# Patient Record
Sex: Male | Born: 1955 | Race: Black or African American | Hispanic: No | Marital: Married | Smoking: Current every day smoker
Health system: Southern US, Community
[De-identification: ages and names within clinical notes are randomized; demographics above are authoritative.]

## PROBLEM LIST (undated history)

## (undated) DIAGNOSIS — E291 Testicular hypofunction: Secondary | ICD-10-CM

## (undated) DIAGNOSIS — D649 Anemia, unspecified: Secondary | ICD-10-CM

## (undated) DIAGNOSIS — E538 Deficiency of other specified B group vitamins: Secondary | ICD-10-CM

## (undated) DIAGNOSIS — S0300XA Dislocation of jaw, unspecified side, initial encounter: Secondary | ICD-10-CM

## (undated) HISTORY — PX: COLONOSCOPY: SHX174

## (undated) HISTORY — PX: JOINT REPLACEMENT: SHX530

---

## 2008-02-08 ENCOUNTER — Ambulatory Visit: Payer: Self-pay | Admitting: Internal Medicine

## 2008-07-17 ENCOUNTER — Emergency Department: Payer: Self-pay | Admitting: Emergency Medicine

## 2011-07-04 ENCOUNTER — Ambulatory Visit: Payer: Self-pay | Admitting: Gastroenterology

## 2011-07-06 LAB — PATHOLOGY REPORT

## 2013-07-22 ENCOUNTER — Ambulatory Visit: Payer: Self-pay | Admitting: Gastroenterology

## 2013-07-24 LAB — PATHOLOGY REPORT

## 2013-09-23 ENCOUNTER — Ambulatory Visit: Payer: Self-pay | Admitting: Gastroenterology

## 2017-11-28 DIAGNOSIS — E538 Deficiency of other specified B group vitamins: Secondary | ICD-10-CM

## 2017-11-28 HISTORY — DX: Deficiency of other specified B group vitamins: E53.8

## 2018-02-08 ENCOUNTER — Ambulatory Visit
Admission: RE | Admit: 2018-02-08 | Discharge: 2018-02-08 | Disposition: A | Discharge status: Home / Self Care | Payer: Managed Care, Other (non HMO) | Source: Ambulatory Visit | Attending: Surgery | Admitting: Surgery

## 2018-02-08 ENCOUNTER — Other Ambulatory Visit: Payer: Self-pay

## 2018-02-08 ENCOUNTER — Encounter
Admission: RE | Admit: 2018-02-08 | Discharge: 2018-02-08 | Disposition: A | Discharge status: Home / Self Care | Payer: Managed Care, Other (non HMO) | Source: Ambulatory Visit | Attending: Surgery | Admitting: Surgery

## 2018-02-08 DIAGNOSIS — Z01810 Encounter for preprocedural cardiovascular examination: Secondary | ICD-10-CM | POA: Insufficient documentation

## 2018-02-08 DIAGNOSIS — Z01812 Encounter for preprocedural laboratory examination: Secondary | ICD-10-CM | POA: Insufficient documentation

## 2018-02-08 DIAGNOSIS — M1611 Unilateral primary osteoarthritis, right hip: Secondary | ICD-10-CM | POA: Insufficient documentation

## 2018-02-08 DIAGNOSIS — Z01818 Encounter for other preprocedural examination: Secondary | ICD-10-CM | POA: Insufficient documentation

## 2018-02-08 DIAGNOSIS — I498 Other specified cardiac arrhythmias: Secondary | ICD-10-CM | POA: Insufficient documentation

## 2018-02-08 HISTORY — DX: Anemia, unspecified: D64.9

## 2018-02-08 HISTORY — DX: Testicular hypofunction: E29.1

## 2018-02-08 HISTORY — DX: Dislocation of jaw, unspecified side, initial encounter: S03.00XA

## 2018-02-08 HISTORY — DX: Deficiency of other specified B group vitamins: E53.8

## 2018-02-08 LAB — CBC WITH DIFFERENTIAL/PLATELET
BASOS ABS: 0 10*3/uL (ref 0–0.1)
Basophils Relative: 1 %
EOS PCT: 1 %
Eosinophils Absolute: 0.1 10*3/uL (ref 0–0.7)
HEMATOCRIT: 41 % (ref 40.0–52.0)
Hemoglobin: 13.6 g/dL (ref 13.0–18.0)
LYMPHS ABS: 1.3 10*3/uL (ref 1.0–3.6)
LYMPHS PCT: 25 %
MCH: 31.4 pg (ref 26.0–34.0)
MCHC: 33.2 g/dL (ref 32.0–36.0)
MCV: 94.7 fL (ref 80.0–100.0)
MONO ABS: 0.7 10*3/uL (ref 0.2–1.0)
MONOS PCT: 13 %
NEUTROS ABS: 3.1 10*3/uL (ref 1.4–6.5)
Neutrophils Relative %: 60 %
Platelets: 236 10*3/uL (ref 150–440)
RBC: 4.33 MIL/uL — ABNORMAL LOW (ref 4.40–5.90)
RDW: 13.6 % (ref 11.5–14.5)
WBC: 5.1 10*3/uL (ref 3.8–10.6)

## 2018-02-08 LAB — URINALYSIS, ROUTINE W REFLEX MICROSCOPIC
Bilirubin Urine: NEGATIVE
Glucose, UA: NEGATIVE mg/dL
HGB URINE DIPSTICK: NEGATIVE
Ketones, ur: NEGATIVE mg/dL
Leukocytes, UA: NEGATIVE
NITRITE: NEGATIVE
PH: 5 (ref 5.0–8.0)
Protein, ur: NEGATIVE mg/dL
SPECIFIC GRAVITY, URINE: 1.016 (ref 1.005–1.030)

## 2018-02-08 LAB — BASIC METABOLIC PANEL
ANION GAP: 9 (ref 5–15)
BUN: 15 mg/dL (ref 6–20)
CALCIUM: 9.2 mg/dL (ref 8.9–10.3)
CO2: 24 mmol/L (ref 22–32)
Chloride: 108 mmol/L (ref 101–111)
Creatinine, Ser: 0.77 mg/dL (ref 0.61–1.24)
GFR calc Af Amer: >60 mL/min (ref >60)
GFR calc non Af Amer: >60 mL/min (ref >60)
GLUCOSE: 111 mg/dL — AB (ref 65–99)
Potassium: 4.3 mmol/L (ref 3.5–5.1)
Sodium: 141 mmol/L (ref 135–145)

## 2018-02-08 LAB — SURGICAL PCR SCREEN
MRSA, PCR: NEGATIVE
STAPHYLOCOCCUS AUREUS: NEGATIVE

## 2018-02-08 LAB — PROTIME-INR
INR: 0.92
Prothrombin Time: 12.3 seconds (ref 11.4–15.2)

## 2018-02-08 NOTE — Patient Instructions (Signed)
Your procedure is scheduled ZO:XWRUEAV, MARCH 26TH  Report to  THE SECOND FLOOR OF THE MEDICAL MALL  To find out your arrival time please call [phone] between         1PM - 3PM on Monday, MARCH 25TH  Remember: Instructions that are not followed completely may result in serious  medical risk, up to and including death, or upon the discretion of your surgeon  and anesthesiologist your surgery may need to be rescheduled.     _X__ 1. Do not eat food after midnight the night before your procedure.                 No gum chewing, lozengers or hard candies.                   You may drink clear liquids up to 2 hours before you are scheduled to                  arrive for your surgery- DO not drink clear                 liquids within 2 hours of the start of your surgery.                  Clear Liquids include:  water, apple juice without pulp, clear carbohydrate                 drink such as Clearfast of Gartorade, Black Coffee or Tea (Do not add                 anything to coffee or tea).  __X__2.  On the morning of surgery brush your teeth with toothpaste and water,                           you may rinse your mouth with mouthwash if you wish.                                 Do not swallow any  toothpaste of mouthwash.     _X__ 3.  No Alcohol for 24 hours before or after surgery.   _X__ 4.  Do Not Smoke or use e-cigarettes For 24 Hours Prior to Your Surgery.                 Do not use any chewable tobacco products for at least 6 hours prior to                 surgery.  ____  5.  Bring all medications with you on the day of surgery if instructed.   _x___  6.  Notify your doctor if there is any change in your medical condition      (cold, fever, infections).     Do not wear jewelry, make-up, hairpins, clips or nail polish. Do not wear lotions, powders, or perfumes. You may wear deodorant. Do not shave 48 hours prior to surgery. Men may shave face and  neck. Do not bring valuables to the hospital.    [facility] is not responsible for any belongings or valuables.  Contacts, dentures or bridgework may not be worn into surgery. Leave your suitcase in the car. After surgery it may be brought to your room. For patients admitted to the hospital, discharge time is determined by your treatment team.  Patients discharged the day of surgery will not be allowed to drive home.   Please read over the following fact sheets that you were given:   PREPARING FOR SURGERY                 MRSA:STOP THE SPREAD          ____ Take these medicines the morning of surgery with A SIP OF WATER:    1. NONE  2.   3.   4.    ____ Fleet Enema (as directed)   _X___ Use CHG Soap as directed  __X__ Stop ASPIRIN PRODUCTS ON THE 19TH  __X__ Stop Anti-inflammatories on AS OF THE 19TH                       YOU MAY CONTINUE TO TAKE TYLENOL   ____ Stop supplements until after surgery.    ____ Bring C-Pap to the hospital.   BRING MOUTH PIECE TO HOSPITAL WITH YOU  HAVE COMFORTABLE AND STURDY SHOES FOR       POST OP  HAVE STOOL SOFTENERS AT HOME FOR AFTER SURGERY

## 2018-02-08 NOTE — Pre-Procedure Instructions (Signed)
Patient denies having problem with NSAIDS or having had a GI bleed. He does not use NSAIDS normally but that is by choice.

## 2018-02-19 MED ORDER — CEFAZOLIN SODIUM-DEXTROSE 2-4 GM/100ML-% IV SOLN
2.0000 g | Freq: Once | INTRAVENOUS | Status: AC
  Administered 2018-02-20: 2 g via INTRAVENOUS

## 2018-02-20 ENCOUNTER — Encounter: Payer: Self-pay | Admitting: *Deleted

## 2018-02-20 ENCOUNTER — Inpatient Hospital Stay: Payer: Managed Care, Other (non HMO) | Admitting: Anesthesiology

## 2018-02-20 ENCOUNTER — Other Ambulatory Visit: Payer: Self-pay

## 2018-02-20 ENCOUNTER — Encounter
Admission: RE | Disposition: A | Discharge status: Home Health | Payer: Self-pay | Source: Ambulatory Visit | Attending: Surgery

## 2018-02-20 ENCOUNTER — Inpatient Hospital Stay
Admission: RE | Admit: 2018-02-20 | Discharge: 2018-02-21 | DRG: 470 | Disposition: A | Discharge status: Home Health | Payer: Managed Care, Other (non HMO) | Source: Ambulatory Visit | Attending: Surgery | Admitting: Surgery

## 2018-02-20 ENCOUNTER — Inpatient Hospital Stay: Payer: Managed Care, Other (non HMO)

## 2018-02-20 DIAGNOSIS — K219 Gastro-esophageal reflux disease without esophagitis: Secondary | ICD-10-CM | POA: Diagnosis present

## 2018-02-20 DIAGNOSIS — M1611 Unilateral primary osteoarthritis, right hip: Principal | ICD-10-CM | POA: Diagnosis present

## 2018-02-20 DIAGNOSIS — Z96642 Presence of left artificial hip joint: Secondary | ICD-10-CM | POA: Diagnosis present

## 2018-02-20 DIAGNOSIS — F17210 Nicotine dependence, cigarettes, uncomplicated: Secondary | ICD-10-CM | POA: Diagnosis present

## 2018-02-20 DIAGNOSIS — R509 Fever, unspecified: Secondary | ICD-10-CM | POA: Diagnosis not present

## 2018-02-20 DIAGNOSIS — Z96641 Presence of right artificial hip joint: Secondary | ICD-10-CM

## 2018-02-20 DIAGNOSIS — E291 Testicular hypofunction: Secondary | ICD-10-CM | POA: Diagnosis present

## 2018-02-20 DIAGNOSIS — Z79899 Other long term (current) drug therapy: Secondary | ICD-10-CM | POA: Diagnosis not present

## 2018-02-20 HISTORY — PX: TOTAL HIP ARTHROPLASTY: SHX124

## 2018-02-20 LAB — ABO/RH: ABO/RH(D): O POS

## 2018-02-20 SURGERY — ARTHROPLASTY, HIP, TOTAL,POSTERIOR APPROACH
Anesthesia: Spinal | Site: Hip | Laterality: Right | Wound class: Clean

## 2018-02-20 MED ORDER — CEFAZOLIN SODIUM-DEXTROSE 2-4 GM/100ML-% IV SOLN
2.0000 g | Freq: Four times a day (QID) | INTRAVENOUS | Status: AC
  Administered 2018-02-20 – 2018-02-21 (×3): 2 g via INTRAVENOUS
  Filled 2018-02-20 (×3): qty 100

## 2018-02-20 MED ORDER — MIDAZOLAM HCL 5 MG/5ML IJ SOLN
INTRAMUSCULAR | Status: DC | PRN
  Administered 2018-02-20: 1 mg via INTRAVENOUS

## 2018-02-20 MED ORDER — TRANEXAMIC ACID 1000 MG/10ML IV SOLN
INTRAVENOUS | Status: AC | PRN
  Administered 2018-02-20: 1000 mg via INTRAVENOUS

## 2018-02-20 MED ORDER — BUPIVACAINE HCL (PF) 0.5 % IJ SOLN
INTRAMUSCULAR | Status: DC | PRN
  Administered 2018-02-20: 3 mL

## 2018-02-20 MED ORDER — PROPOFOL 500 MG/50ML IV EMUL
INTRAVENOUS | Status: AC
  Filled 2018-02-20: qty 50

## 2018-02-20 MED ORDER — SODIUM CHLORIDE 0.9 % IJ SOLN
INTRAMUSCULAR | Status: AC
Start: 2018-02-20 — End: 2018-02-20
  Filled 2018-02-20: qty 50

## 2018-02-20 MED ORDER — KETOROLAC TROMETHAMINE 15 MG/ML IJ SOLN
15.0000 mg | Freq: Four times a day (QID) | INTRAMUSCULAR | Status: AC
  Administered 2018-02-20 – 2018-02-21 (×4): 15 mg via INTRAVENOUS
  Filled 2018-02-20 (×4): qty 1

## 2018-02-20 MED ORDER — PHENYLEPHRINE HCL 10 MG/ML IJ SOLN
INTRAMUSCULAR | Status: DC | PRN
  Administered 2018-02-20: 30 ug/min via INTRAVENOUS

## 2018-02-20 MED ORDER — BUPIVACAINE HCL (PF) 0.5 % IJ SOLN
INTRAMUSCULAR | Status: AC
  Filled 2018-02-20: qty 10

## 2018-02-20 MED ORDER — ONDANSETRON HCL 4 MG/2ML IJ SOLN: 4.0000 mg | Freq: Four times a day (QID) | INTRAMUSCULAR | Status: DC | PRN

## 2018-02-20 MED ORDER — TRAMADOL HCL 50 MG PO TABS
50.0000 mg | ORAL_TABLET | Freq: Four times a day (QID) | ORAL | Status: DC
  Administered 2018-02-20 – 2018-02-21 (×5): 50 mg via ORAL
  Filled 2018-02-20 (×5): qty 1

## 2018-02-20 MED ORDER — LACTATED RINGERS IV SOLN
INTRAVENOUS | Status: DC
  Administered 2018-02-20 (×3): via INTRAVENOUS

## 2018-02-20 MED ORDER — PROPOFOL 500 MG/50ML IV EMUL
INTRAVENOUS | Status: DC | PRN
Start: 2018-02-20 — End: 2018-02-20
  Administered 2018-02-20: 70 ug/kg/min via INTRAVENOUS

## 2018-02-20 MED ORDER — TRANEXAMIC ACID 1000 MG/10ML IV SOLN
INTRAVENOUS | Status: AC
  Filled 2018-02-20: qty 10

## 2018-02-20 MED ORDER — FENTANYL CITRATE (PF) 100 MCG/2ML IJ SOLN
INTRAMUSCULAR | Status: AC
  Filled 2018-02-20: qty 2

## 2018-02-20 MED ORDER — BUPIVACAINE LIPOSOME 1.3 % IJ SUSP
INTRAMUSCULAR | Status: AC
Start: 2018-02-20 — End: 2018-02-20
  Filled 2018-02-20: qty 20

## 2018-02-20 MED ORDER — GLYCOPYRROLATE 0.2 MG/ML IJ SOLN
INTRAMUSCULAR | Status: AC
  Filled 2018-02-20: qty 1

## 2018-02-20 MED ORDER — FENTANYL CITRATE (PF) 100 MCG/2ML IJ SOLN
INTRAMUSCULAR | Status: DC | PRN
  Administered 2018-02-20: 100 ug via INTRAVENOUS

## 2018-02-20 MED ORDER — ONDANSETRON HCL 4 MG PO TABS: 4.0000 mg | ORAL_TABLET | Freq: Four times a day (QID) | ORAL | Status: DC | PRN

## 2018-02-20 MED ORDER — MAGNESIUM HYDROXIDE 400 MG/5ML PO SUSP
30.0000 mL | Freq: Every day | ORAL | Status: DC | PRN
  Administered 2018-02-21: 30 mL via ORAL
  Filled 2018-02-20: qty 30

## 2018-02-20 MED ORDER — BISACODYL 10 MG RE SUPP: 10.0000 mg | Freq: Every day | RECTAL | Status: DC | PRN

## 2018-02-20 MED ORDER — NEOMYCIN-POLYMYXIN B GU 40-200000 IR SOLN
Status: DC | PRN
  Administered 2018-02-20: 4 mL

## 2018-02-20 MED ORDER — FLEET ENEMA 7-19 GM/118ML RE ENEM: 1.0000 | ENEMA | Freq: Once | RECTAL | Status: DC | PRN

## 2018-02-20 MED ORDER — HYDROMORPHONE HCL 1 MG/ML IJ SOLN: 0.5000 mg | INTRAMUSCULAR | Status: DC | PRN

## 2018-02-20 MED ORDER — MIDAZOLAM HCL 2 MG/2ML IJ SOLN
INTRAMUSCULAR | Status: AC
  Filled 2018-02-20: qty 2

## 2018-02-20 MED ORDER — SODIUM CHLORIDE 0.9 % IV SOLN
INTRAVENOUS | Status: DC
  Administered 2018-02-20 – 2018-02-21 (×2): via INTRAVENOUS

## 2018-02-20 MED ORDER — LIDOCAINE HCL (PF) 2 % IJ SOLN
INTRAMUSCULAR | Status: AC
  Filled 2018-02-20: qty 10

## 2018-02-20 MED ORDER — ENOXAPARIN SODIUM 40 MG/0.4ML ~~LOC~~ SOLN
40.0000 mg | SUBCUTANEOUS | Status: DC
  Administered 2018-02-21: 40 mg via SUBCUTANEOUS
  Filled 2018-02-20: qty 0.4

## 2018-02-20 MED ORDER — FENTANYL CITRATE (PF) 100 MCG/2ML IJ SOLN: 25.0000 ug | INTRAMUSCULAR | Status: DC | PRN

## 2018-02-20 MED ORDER — DOCUSATE SODIUM 100 MG PO CAPS
100.0000 mg | ORAL_CAPSULE | Freq: Two times a day (BID) | ORAL | Status: DC
  Administered 2018-02-20 – 2018-02-21 (×3): 100 mg via ORAL
  Filled 2018-02-20 (×3): qty 1

## 2018-02-20 MED ORDER — PANTOPRAZOLE SODIUM 40 MG PO TBEC
40.0000 mg | DELAYED_RELEASE_TABLET | Freq: Every day | ORAL | Status: DC
  Administered 2018-02-20 – 2018-02-21 (×2): 40 mg via ORAL
  Filled 2018-02-20 (×2): qty 1

## 2018-02-20 MED ORDER — SODIUM CHLORIDE 0.9 % IV SOLN
INTRAVENOUS | Status: DC | PRN
  Administered 2018-02-20: 60 mL

## 2018-02-20 MED ORDER — KETOROLAC TROMETHAMINE 30 MG/ML IJ SOLN
30.0000 mg | Freq: Once | INTRAMUSCULAR | Status: AC
  Administered 2018-02-20: 30 mg via INTRAVENOUS

## 2018-02-20 MED ORDER — PROPOFOL 10 MG/ML IV BOLUS
INTRAVENOUS | Status: DC | PRN
  Administered 2018-02-20: 40 mg via INTRAVENOUS

## 2018-02-20 MED ORDER — BUPIVACAINE-EPINEPHRINE (PF) 0.5% -1:200000 IJ SOLN
INTRAMUSCULAR | Status: DC | PRN
  Administered 2018-02-20: 30 mL via PERINEURAL

## 2018-02-20 MED ORDER — DIPHENHYDRAMINE HCL 12.5 MG/5ML PO ELIX: 12.5000 mg | ORAL_SOLUTION | ORAL | Status: DC | PRN

## 2018-02-20 MED ORDER — ACETAMINOPHEN 500 MG PO TABS
1000.0000 mg | ORAL_TABLET | Freq: Four times a day (QID) | ORAL | Status: AC
  Administered 2018-02-20 – 2018-02-21 (×4): 1000 mg via ORAL
  Filled 2018-02-20 (×4): qty 2

## 2018-02-20 MED ORDER — ACETAMINOPHEN 325 MG PO TABS: 325.0000 mg | ORAL_TABLET | Freq: Four times a day (QID) | ORAL | Status: DC | PRN

## 2018-02-20 MED ORDER — OXYCODONE HCL 5 MG PO TABS
5.0000 mg | ORAL_TABLET | ORAL | Status: DC | PRN
  Administered 2018-02-20: 10 mg via ORAL
  Administered 2018-02-20: 5 mg via ORAL
  Administered 2018-02-21: 10 mg via ORAL
  Filled 2018-02-20 (×2): qty 2
  Filled 2018-02-20: qty 1

## 2018-02-20 MED ORDER — METOCLOPRAMIDE HCL 10 MG PO TABS: 5.0000 mg | ORAL_TABLET | Freq: Three times a day (TID) | ORAL | Status: DC | PRN

## 2018-02-20 MED ORDER — ONDANSETRON HCL 4 MG/2ML IJ SOLN: 4.0000 mg | Freq: Once | INTRAMUSCULAR | Status: DC | PRN

## 2018-02-20 MED ORDER — NEOMYCIN-POLYMYXIN B GU 40-200000 IR SOLN
Status: AC
Start: 2018-02-20 — End: 2018-02-20
  Filled 2018-02-20: qty 20

## 2018-02-20 MED ORDER — FAMOTIDINE 20 MG PO TABS
ORAL_TABLET | ORAL | Status: AC
  Administered 2018-02-20: 20 mg via ORAL
  Filled 2018-02-20: qty 1

## 2018-02-20 MED ORDER — PHENYLEPHRINE HCL 10 MG/ML IJ SOLN
INTRAMUSCULAR | Status: AC
  Filled 2018-02-20: qty 1

## 2018-02-20 MED ORDER — METOCLOPRAMIDE HCL 5 MG/ML IJ SOLN: 5.0000 mg | Freq: Three times a day (TID) | INTRAMUSCULAR | Status: DC | PRN

## 2018-02-20 MED ORDER — KETOROLAC TROMETHAMINE 30 MG/ML IJ SOLN
INTRAMUSCULAR | Status: AC
  Administered 2018-02-20: 30 mg via INTRAVENOUS
  Filled 2018-02-20: qty 1

## 2018-02-20 MED ORDER — FAMOTIDINE 20 MG PO TABS
20.0000 mg | ORAL_TABLET | Freq: Once | ORAL | Status: AC
  Administered 2018-02-20: 20 mg via ORAL

## 2018-02-20 MED ORDER — BUPIVACAINE-EPINEPHRINE (PF) 0.5% -1:200000 IJ SOLN
INTRAMUSCULAR | Status: AC
  Filled 2018-02-20: qty 30

## 2018-02-20 MED ORDER — PROPOFOL 10 MG/ML IV BOLUS
INTRAVENOUS | Status: AC
  Filled 2018-02-20: qty 20

## 2018-02-20 MED ORDER — CEFAZOLIN SODIUM-DEXTROSE 2-4 GM/100ML-% IV SOLN
INTRAVENOUS | Status: AC
  Filled 2018-02-20: qty 100

## 2018-02-20 SURGICAL SUPPLY — 59 items
BIT DRILL 2 MINI QC DISP (BIT) ×3 IMPLANT
BLADE SAGITTAL WIDE XTHICK NO (BLADE) ×3 IMPLANT
BLADE SURG SZ20 CARB STEEL (BLADE) ×3 IMPLANT
CANISTER SUCT 1200ML W/VALVE (MISCELLANEOUS) ×3 IMPLANT
CANISTER SUCT 3000ML PPV (MISCELLANEOUS) ×6 IMPLANT
CHLORAPREP W/TINT 26ML (MISCELLANEOUS) ×3 IMPLANT
DRAPE IMP U-DRAPE 54X76 (DRAPES) ×3 IMPLANT
DRAPE INCISE IOBAN 66X60 STRL (DRAPES) ×3 IMPLANT
DRAPE SHEET LG 3/4 BI-LAMINATE (DRAPES) ×3 IMPLANT
DRAPE SURG 17X11 SM STRL (DRAPES) ×6 IMPLANT
DRAPE TABLE BACK 80X90 (DRAPES) ×3 IMPLANT
DRSG OPSITE POSTOP 4X10 (GAUZE/BANDAGES/DRESSINGS) ×3 IMPLANT
DRSG OPSITE POSTOP 4X12 (GAUZE/BANDAGES/DRESSINGS) ×3 IMPLANT
DRSG OPSITE POSTOP 4X14 (GAUZE/BANDAGES/DRESSINGS) ×3 IMPLANT
ELECT BLADE 6.5 EXT (BLADE) ×3 IMPLANT
ELECT CAUTERY BLADE 6.4 (BLADE) ×3 IMPLANT
GLOVE BIO SURGEON STRL SZ7.5 (GLOVE) ×12 IMPLANT
GLOVE BIO SURGEON STRL SZ8 (GLOVE) ×12 IMPLANT
GLOVE BIOGEL PI IND STRL 8 (GLOVE) ×1 IMPLANT
GLOVE BIOGEL PI INDICATOR 8 (GLOVE) ×2
GLOVE INDICATOR 8.0 STRL GRN (GLOVE) ×3 IMPLANT
GOWN STRL REUS W/ TWL LRG LVL3 (GOWN DISPOSABLE) ×1 IMPLANT
GOWN STRL REUS W/ TWL XL LVL3 (GOWN DISPOSABLE) ×1 IMPLANT
GOWN STRL REUS W/TWL LRG LVL3 (GOWN DISPOSABLE) ×2
GOWN STRL REUS W/TWL XL LVL3 (GOWN DISPOSABLE) ×2
HANDLE YANKAUER SUCT BULB TIP (MISCELLANEOUS) ×3 IMPLANT
HEAD BIOLOX DELTA 36 I +3 HIP (Head) ×3 IMPLANT
HOOD PEEL AWAY FLYTE STAYCOOL (MISCELLANEOUS) ×9 IMPLANT
K-WIRE .8 (WIRE) ×3 IMPLANT
KIT TURNOVER KIT A (KITS) ×3 IMPLANT
LINER ACETABULAR G7 SZ36 G (Liner) ×3 IMPLANT
NDL SAFETY ECLIPSE 18X1.5 (NEEDLE) ×2 IMPLANT
NEEDLE FILTER BLUNT 18X 1/2SAF (NEEDLE) ×2
NEEDLE FILTER BLUNT 18X1 1/2 (NEEDLE) ×1 IMPLANT
NEEDLE HYPO 18GX1.5 SHARP (NEEDLE) ×4
NEEDLE SPNL 20GX3.5 QUINCKE YW (NEEDLE) ×3 IMPLANT
PACK HIP PROSTHESIS (MISCELLANEOUS) ×3 IMPLANT
PILLOW ABDUC SM (MISCELLANEOUS) ×3 IMPLANT
PILLOW ABDUCTION MEDIUM (MISCELLANEOUS) ×3 IMPLANT
PIN STEINMANN 3/16X9 BAY 6PK (Pin) ×1 IMPLANT
PULSAVAC PLUS IRRIG FAN TIP (DISPOSABLE) ×3
SHELL ACETABULAR 3H 58MM G7 (Hips) ×3 IMPLANT
SOL .9 NS 3000ML IRR  AL (IV SOLUTION) ×2
SOL .9 NS 3000ML IRR UROMATIC (IV SOLUTION) ×1 IMPLANT
SPONGE LAP 18X18 5 PK (GAUZE/BANDAGES/DRESSINGS) ×3 IMPLANT
ST PIN 3/16X9 BAY 6PK (Pin) ×3 IMPLANT
STAPLER SKIN PROX 35W (STAPLE) ×3 IMPLANT
STEM COLLARLESS FULL 13X145MM (Stem) ×3 IMPLANT
SUT TICRON 2-0 30IN 311381 (SUTURE) ×12 IMPLANT
SUT VIC AB 0 CT1 36 (SUTURE) ×3 IMPLANT
SUT VIC AB 1 CT1 36 (SUTURE) ×6 IMPLANT
SUT VIC AB 2-0 CT1 (SUTURE) ×12 IMPLANT
SYR 10ML LL (SYRINGE) ×3 IMPLANT
SYR 20CC LL (SYRINGE) ×3 IMPLANT
SYR 30ML LL (SYRINGE) ×9 IMPLANT
TAPE TRANSPORE STRL 2 31045 (GAUZE/BANDAGES/DRESSINGS) ×3 IMPLANT
TIP FAN IRRIG PULSAVAC PLUS (DISPOSABLE) ×1 IMPLANT
TRAY FOLEY W/METER SILVER 16FR (SET/KITS/TRAYS/PACK) ×3 IMPLANT
WATER STERILE IRR 1000ML POUR (IV SOLUTION) ×3 IMPLANT

## 2018-02-20 NOTE — Anesthesia Post-op Follow-up Note (Signed)
Anesthesia QCDR form completed.        

## 2018-02-20 NOTE — Evaluation (Signed)
Physical Therapy Evaluation Patient Details Name: Anthony LoaderSteven A Balbach MRN: [id number] DOB: [birth year] Today's Date: 02/20/2018   History of Present Illness  62 y/o male s/p hip replacement (posterior approach) 3/26  Clinical Impression  Pt did very well with POD0 exam, even walking ~100 ft with consistent cadence and speed.  He showed good quad strength, but was not able to do a SLR w/o assist.  Pt very motivated and eager to work with PT, actually had less pain (3/10) post session than on arrival.  Pt should be able to do outpatient PT assuming he continued with his current trajectory.  He showed great effort with ~12 minutes of bed exercises apart from the PT exam.    Follow Up Recommendations Outpatient PT(per continued/expected trajectory)    Equipment Recommendations       Recommendations for Other Services       Precautions / Restrictions Precautions Precautions: Posterior Hip;Fall Restrictions Weight Bearing Restrictions: Yes RLE Weight Bearing: Weight bearing as tolerated      Mobility  Bed Mobility Overal bed mobility: Modified Independent             General bed mobility comments: mpt heavily reliant on rails/trapeze, but able to get to sitting w/o direct assist  Transfers Overall transfer level: Independent Equipment used: Rolling walker (2 wheeled)             General transfer comment: minimal cuing for hand placement, but able to rise and sit w/o issue   Ambulation/Gait Ambulation/Gait assistance: Supervision Ambulation Distance (Feet): 100 Feet Assistive device: Rolling walker (2 wheeled)       General Gait Details: Pt with a few hesitant steps to start, but quickly adjusted and was able to take maintain consistent walker motion and appropriate cadence with no limp/hesitation  Stairs            Wheelchair Mobility    Modified Rankin (Stroke Patients Only)       Balance Overall balance assessment: Modified Independent                                           Pertinent Vitals/Pain Pain Assessment: 0-10 Pain Score: 5 (pain actually 3/10 post session)    Home Living Family/patient expects to be discharged to:: Private residence Living Arrangements: Spouse/significant other Available Help at Discharge: Family   Home Access: Stairs to enter Entrance Stairs-Rails: Can reach both Entrance Stairs-Number of Steps: 4 Home Layout: One level Home Equipment: Environmental consultantWalker - 2 wheels      Prior Function Level of Independence: Independent         Comments: Pt works a relatively phyiscal job, able to be active     Higher education careers adviserHand Dominance        Extremity/Trunk Assessment   Upper Extremity Assessment Upper Extremity Assessment: Overall WFL for tasks assessed    Lower Extremity Assessment Lower Extremity Assessment: (expected R LE post-op weakness, unable to do SLRs)       Communication   Communication: No difficulties  Cognition Arousal/Alertness: Awake/alert Behavior During Therapy: WFL for tasks assessed/performed Overall Cognitive Status: Within Functional Limits for tasks assessed                                        General Comments      Exercises  Total Joint Exercises Ankle Circles/Pumps: Strengthening;10 reps Quad Sets: Strengthening;10 reps Gluteal Sets: Strengthening;10 reps Short Arc Quad: AROM;10 reps Heel Slides: AAROM;Strengthening;10 reps Hip ABduction/ADduction: AROM;10 reps Straight Leg Raises: AAROM;5 reps(unable to hold against gravity)   Assessment/Plan    PT Assessment Patient needs continued PT services  PT Problem List Decreased strength;Decreased range of motion;Decreased activity tolerance;Decreased balance;Decreased mobility;Decreased coordination;Decreased knowledge of use of DME;Decreased safety awareness;Decreased knowledge of precautions;Pain       PT Treatment Interventions DME instruction;Gait training;Stair training;Functional mobility  training;Therapeutic exercise;Therapeutic activities;Balance training;Neuromuscular re-education;Cognitive remediation;Patient/family education    PT Goals (Current goals can be found in the Care Plan section)  Acute Rehab PT Goals Patient Stated Goal: go home PT Goal Formulation: With patient Time For Goal Achievement: 03/06/18 Potential to Achieve Goals: Good    Frequency BID   Barriers to discharge        Co-evaluation               AM-PAC PT "6 Clicks" Daily Activity  Outcome Measure Difficulty turning over in bed (including adjusting bedclothes, sheets and blankets)?: None Difficulty moving from lying on back to sitting on the side of the bed? : A Little Difficulty sitting down on and standing up from a chair with arms (e.g., wheelchair, bedside commode, etc,.)?: None Help needed moving to and from a bed to chair (including a wheelchair)?: None Help needed walking in hospital room?: None Help needed climbing 3-5 steps with a railing? : None 6 Click Score: 23    End of Session Equipment Utilized During Treatment: Gait belt Activity Tolerance: Patient tolerated treatment well Patient left: with chair alarm set;with call bell/phone within reach;with family/visitor present Nurse Communication: Mobility status PT Visit Diagnosis: Muscle weakness (generalized) (M62.81);Difficulty in walking, not elsewhere classified (R26.2)    Time: 1610-9604 PT Time Calculation (min) (ACUTE ONLY): 26 min   Charges:   PT Evaluation $PT Eval Low Complexity: 1 Low PT Treatments $Therapeutic Exercise: 8-22 mins   PT G Codes:        Malachi Pro, DPT 02/20/2018, 5:07 PM

## 2018-02-20 NOTE — Care Management (Signed)
PT recommending outpatient PT. Spoke with Dr. Joice LoftsPoggi. He states he has no problem with patient going to outpatient PT if he does well with PT tomorrow once some of the numbing has worn off. Will reassess tomorrow follow am PT session.

## 2018-02-20 NOTE — Transfer of Care (Signed)
Immediate Anesthesia Transfer of Care Note  Patient: Anthony LoaderSteven A Rivas  Procedure(s) Performed: TOTAL HIP ARTHROPLASTY (Right Hip)  Patient Location: PACU  Anesthesia Type:Spinal  Level of Consciousness: awake and patient cooperative  Airway & Oxygen Therapy: Patient Spontanous Breathing and Patient connected to nasal cannula oxygen  Post-op Assessment: Report given to RN and Post -op Vital signs reviewed and stable  Post vital signs: Reviewed and stable  Last Vitals:  Vitals Value Taken Time  BP    Temp    Pulse 65 02/20/2018 10:07 AM  Resp 7 02/20/2018 10:07 AM  SpO2 100 % 02/20/2018 10:07 AM  Vitals shown include unvalidated device data.  Last Pain:  Vitals:   02/20/18 0613  PainSc: 7          Complications: No apparent anesthesia complications

## 2018-02-20 NOTE — Anesthesia Preprocedure Evaluation (Signed)
Anesthesia Evaluation  Patient identified by MRN, date of birth, ID band Patient awake    Reviewed: Allergy & Precautions, NPO status , Patient's Chart, lab work & pertinent test results  History of Anesthesia Complications Negative for: history of anesthetic complications  Airway Mallampati: II       Dental   Pulmonary neg sleep apnea, neg COPD, Current Smoker,           Cardiovascular (-) hypertension(-) Past MI and (-) CHF (-) dysrhythmias (-) Valvular Problems/Murmurs     Neuro/Psych neg Seizures    GI/Hepatic Neg liver ROS, GERD (rarely)  ,  Endo/Other  neg diabetes  Renal/GU negative Renal ROS     Musculoskeletal   Abdominal   Peds  Hematology  (+) anemia ,   Anesthesia Other Findings   Reproductive/Obstetrics                             Anesthesia Physical Anesthesia Plan  ASA: II  Anesthesia Plan: Spinal   Post-op Pain Management:    Induction:   PONV Risk Score and Plan:   Airway Management Planned: Nasal Cannula  Additional Equipment:   Intra-op Plan:   Post-operative Plan:   Informed Consent: I have reviewed the patients History and Physical, chart, labs and discussed the procedure including the risks, benefits and alternatives for the proposed anesthesia with the patient or authorized representative who has indicated his/her understanding and acceptance.     Plan Discussed with:   Anesthesia Plan Comments:         Anesthesia Quick Evaluation

## 2018-02-20 NOTE — H&P (Signed)
Paper H&P to be scanned into permanent record. H&P reviewed and patient re-examined. No changes. 

## 2018-02-20 NOTE — Op Note (Signed)
02/20/2018  10:13 AM  Patient:   Anthony LoaderSteven A Rivas  Pre-Op Diagnosis:   Degenerative joint disease, right hip.  Post-Op Diagnosis:   Same.  Procedure:   Right total hip arthroplasty.  Surgeon:   Maryagnes AmosJ. Jeffrey Arelene Moroni, MD  Assistant:   Horris LatinoLance McGhee, PA-C  Anesthesia:   Spinal  Findings:   As above.  Complications:   None  EBL:   200 cc  Fluids:   2200 cc crystalloid  UOP:   400 cc  TT:   None  Drains:   None  Closure:   Staples  Implants:   Biomet press-fit system with a #13 laterally offset Echo femoral stem, a 58 mm acetabular shell with an E-poly hi-wall liner, and a 36 mm ceramic head with a +3 mm neck.  Brief Clinical Note:   The patient is a 62 year old male with a long history of gradually worsening right hip pain.  His symptoms have progressed despite medications, activity modification, etc.  His history and examination consistent with degenerative joint disease confirmed by plain radiographs.  The patient presents at this time for a right total hip arthroplasty..   Procedure:   The patient was brought into the operating room. After adequate spinal anesthesia was obtained, the patient was repositioned in the left lateral decubitus position and secured using a lateral hip positioner. The right hip and lower extremity were prepped with ChloroPrep solution before being draped sterilely. Preoperative antibiotics were administered. A timeout was performed to verify the appropriate surgical site before a standard posterior approach to the hip was made through an approximately 4-5 inch incision. The incision was carried down through the subcutaneous tissues to expose the gluteal fascia and proximal end of the iliotibial band. These structures were split the length of the incision and the Charnley self-retaining hip retractor placed. The bursal tissues were swept posteriorly to expose the short external rotators. The anterior border of the piriformis tendon was identified and this plane  developed down through the capsule to enter the joint. A flap of tissue was elevated off the posterior aspect of the femoral neck and greater trochanter and retracted posteriorly. This flap included the piriformis tendon, the short external rotators, and the posterior capsule. The soft tissues were elevated off the lateral aspect of the ilium and a large Steinmann pin placed bicortically. With the right leg aligned over the left, a drill bit was placed into the greater trochanter parallel to the Steinmann pin and the distance between these two pins measured in order to optimize leg lengths postoperatively. The drill bit was removed and the hip dislocated. The piriformis fossa was debrided of soft tissues before the intramedullary canal was accessed through this point using a triple step reamer. The canal was reamed sequentially beginning with a #7 tapered reamer and progressing to a #13 tapered reamer. This provided excellent circumferential chatter. Using the appropriate guide, a femoral neck cut was made 10-12 mm above the lesser trochanter. The femoral head was removed.  Attention was directed to the acetabular side. The labrum was debrided circumferentially before the ligamentum teres was removed using a large curette. A line was drawn on the drapes corresponding to the native version of the acetabulum. This line was used as a guide while the acetabulum was reamed sequentially beginning with a 51 mm reamer and progressing to a 57 mm reamer. This provided excellent circumferential chatter. The 57 mm trial acetabulum was positioned and found to fit quite well. Therefore, the 58 mm acetabular shell  was selected and impacted into place with care taken to maintain the appropriate version. The trial high wall liner was inserted.  Attention was redirected to the femoral side. A box osteotome was used to establish version before the canal was broached sequentially beginning with a #9 broach and progressing to a #13  broach. This was left in place and several trial reductions performed using both a standard and laterally offset neck options, as well as the +0 and +3 mm neck lengths. The permanent E-polyethylene hi-wall liner was impacted into the acetabular shell and its locking mechanism verified using a quarter-inch osteotome. Next, the #13 lateral offset femoral stem was impacted into place with care taken to maintain the appropriate version. A repeat trial reduction was performed using the +3 mm neck length. The +3 mm neck length demonstrated excellent stability both in extension and external rotation as well as with flexion to 90 and internal rotation beyond 70. It also was stable in the position of sleep. In addition, leg lengths appeared to be restored appropriately, both by reassessing the position of the right leg over the left, as well as by measuring the distance between the Steinmann pin and the drill bit. The 36 mm ceramic head with the +3 mm neck adapter construct was put together on the back table before being impacted onto the stem of the femoral component. The Morse taper locking mechanism was verified using manual distraction before the head was relocated and placed through a range of motion with the findings as described above.  The wound was copiously irrigated with bacitracin saline solution via the jet lavage system before the peri-incisional and pericapsular tissues were injected with 30 cc of 0.5% Sensorcaine with epinephrine and 20 cc of Exparel diluted out to 60 cc with normal saline to help with postoperative analgesia. The posterior flap was reapproximated to the posterior aspect of the greater trochanter using #2 Tycron interrupted sutures placed through drill holes. Several additional #2 Tycron interrupted sutures were used to reinforce this layer of closure. The iliotibial band was reapproximated using #1 Vicryl interrupted sutures before the gluteal fascia was closed using a running #1 Vicryl  suture. At this point, 1 g of transexemic acid in 10 cc of normal saline was injected into the joint to help reduce postoperative bleeding. The subcutaneous tissues were closed in several layers using 2-0 Vicryl interrupted sutures before the skin was closed using staples. A sterile occlusive dressing was applied to the wound before the patient was placed into an abduction wedge pillow. The patient was then rolled back into the supine position on his/her hospital bed before being awakened and returned to the recovery room in satisfactory condition after tolerating the procedure well.

## 2018-02-20 NOTE — Anesthesia Procedure Notes (Signed)
Spinal  Patient location during procedure: OR Start time: 02/20/2018 7:34 AM End time: 02/20/2018 7:39 AM Staffing Performed: resident/CRNA  Preanesthetic Checklist Completed: patient identified, site marked, surgical consent, pre-op evaluation, timeout performed, IV checked, risks and benefits discussed and monitors and equipment checked Spinal Block Patient position: sitting Prep: ChloraPrep Patient monitoring: heart rate, continuous pulse ox, blood pressure and cardiac monitor Approach: midline Location: L3-4 Injection technique: single-shot Needle Needle type: Introducer and Pencan  Needle gauge: 24 G Needle length: 9 cm Additional Notes Negative paresthesia. Negative blood return. Positive free-flowing CSF. Expiration date of kit checked and confirmed. Patient tolerated procedure well, without complications.

## 2018-02-20 NOTE — NC FL2 (Signed)
[place] MEDICAID FL2 LEVEL OF CARE SCREENING TOOL     IDENTIFICATION  Patient Name: Anthony Rivas Birthdate: [birth year] Sex: male Admission Date (Current Location): 02/20/2018  [place] Number:  Chiropodist and Address:  [facility], [address], [place], [place] [address]      Provider Number: [id number]  Attending Physician Name and Address:  Christena Flake, MD  Relative Name and Phone Number:       Current Level of Care: Hospital Recommended Level of Care: Skilled Nursing Facility Prior Approval Number:    Date Approved/Denied:   PASRR Number: [id number])  Discharge Plan: SNF    Current Diagnoses: Patient Active Problem List   Diagnosis Date Noted  . Status post total hip replacement, right 02/20/2018    Orientation RESPIRATION BLADDER Height & Weight     Self, Time, Situation, Place  Normal Continent Weight: 177 lb 3.2 oz (80.4 kg) Height:  5\' 7"  (170.2 cm)  BEHAVIORAL SYMPTOMS/MOOD NEUROLOGICAL BOWEL NUTRITION STATUS      Continent Diet(Clear liquid to be advanced)  AMBULATORY STATUS COMMUNICATION OF NEEDS Skin   Extensive Assist Verbally Surgical wounds(Incision Right Hip)                       Personal Care Assistance Level of Assistance  Bathing, Feeding, Dressing Bathing Assistance: Limited assistance Feeding assistance: Independent Dressing Assistance: Limited assistance     Functional Limitations Info  Sight, Hearing, Speech Sight Info: Adequate Hearing Info: Adequate Speech Info: Adequate    SPECIAL CARE FACTORS FREQUENCY  PT (By licensed PT), OT (By licensed OT)     PT Frequency: (5) OT Frequency: (5)            Contractures      Additional Factors Info  Code Status, Allergies Code Status Info: (Full Code) Allergies Info: (No Known Allergies)           Current Medications (02/20/2018):  This is the current hospital active medication list Current Facility-Administered  Medications  Medication Dose Route Frequency Provider Last Rate Last Dose  . 0.9 %  sodium chloride infusion   Intravenous Continuous Poggi, Excell Seltzer, MD      . acetaminophen (TYLENOL) tablet 1,000 mg  1,000 mg Oral Q6H Poggi, Excell Seltzer, MD      . Melene Muller ON 02/21/2018] acetaminophen (TYLENOL) tablet 325-650 mg  325-650 mg Oral Q6H PRN Poggi, Excell Seltzer, MD      . bisacodyl (DULCOLAX) suppository 10 mg  10 mg Rectal Daily PRN Poggi, Excell Seltzer, MD      . ceFAZolin (ANCEF) IVPB 2g/100 mL premix  2 g Intravenous Q6H Poggi, Excell Seltzer, MD      . diphenhydrAMINE (BENADRYL) 12.5 MG/5ML elixir 12.5-25 mg  12.5-25 mg Oral Q4H PRN Poggi, Excell Seltzer, MD      . docusate sodium (COLACE) capsule 100 mg  100 mg Oral BID Poggi, Excell Seltzer, MD      . Melene Muller ON 02/21/2018] enoxaparin (LOVENOX) injection 40 mg  40 mg Subcutaneous Q24H Poggi, Excell Seltzer, MD      . HYDROmorphone (DILAUDID) injection 0.5-1 mg  0.5-1 mg Intravenous Q4H PRN Poggi, Excell Seltzer, MD      . ketorolac (TORADOL) 15 MG/ML injection 15 mg  15 mg Intravenous Q6H Poggi, Excell Seltzer, MD      . magnesium hydroxide (MILK OF MAGNESIA) suspension 30 mL  30 mL Oral Daily PRN Poggi, Excell Seltzer, MD      .  metoCLOPramide (REGLAN) tablet 5-10 mg  5-10 mg Oral Q8H PRN Poggi, Excell SeltzerJohn J, MD       Or  . metoCLOPramide (REGLAN) injection 5-10 mg  5-10 mg Intravenous Q8H PRN Poggi, Excell SeltzerJohn J, MD      . ondansetron (ZOFRAN) tablet 4 mg  4 mg Oral Q6H PRN Poggi, Excell SeltzerJohn J, MD       Or  . ondansetron (ZOFRAN) injection 4 mg  4 mg Intravenous Q6H PRN Poggi, Excell SeltzerJohn J, MD      . oxyCODONE (Oxy IR/ROXICODONE) immediate release tablet 5-10 mg  5-10 mg Oral Q4H PRN Poggi, Excell SeltzerJohn J, MD      . pantoprazole (PROTONIX) EC tablet 40 mg  40 mg Oral Daily Poggi, Excell SeltzerJohn J, MD      . sodium phosphate (FLEET) 7-19 GM/118ML enema 1 enema  1 enema Rectal Once PRN Poggi, Excell SeltzerJohn J, MD      . traMADol Janean Sark(ULTRAM) tablet 50 mg  50 mg Oral Q6H Poggi, Excell SeltzerJohn J, MD         Discharge Medications: Please see discharge summary for a list of discharge  medications.  Relevant Imaging Results:  Relevant Lab Results:   Additional Information (SSN: [id number])  Payton SparkAnanda A Ibeth Fahmy, Student-Social Work

## 2018-02-21 ENCOUNTER — Encounter: Payer: Self-pay | Admitting: Surgery

## 2018-02-21 LAB — BPAM RBC
Blood Product Expiration Date: 201904082359
Blood Product Expiration Date: 201904112359
UNIT TYPE AND RH: 5100
UNIT TYPE AND RH: 5100

## 2018-02-21 LAB — TYPE AND SCREEN
ABO/RH(D): O POS
ANTIBODY SCREEN: POSITIVE
PT AG Type: POSITIVE
UNIT DIVISION: 0
UNIT DIVISION: 0
UNIT TAG COMMENT: NEGATIVE
UNIT TAG COMMENT: NEGATIVE

## 2018-02-21 LAB — BASIC METABOLIC PANEL
ANION GAP: 8 (ref 5–15)
BUN: 8 mg/dL (ref 6–20)
CO2: 25 mmol/L (ref 22–32)
Calcium: 8.2 mg/dL — ABNORMAL LOW (ref 8.9–10.3)
Chloride: 105 mmol/L (ref 101–111)
Creatinine, Ser: 0.83 mg/dL (ref 0.61–1.24)
GFR calc Af Amer: >60 mL/min (ref >60)
GFR calc non Af Amer: >60 mL/min (ref >60)
Glucose, Bld: 117 mg/dL — ABNORMAL HIGH (ref 65–99)
POTASSIUM: 3.9 mmol/L (ref 3.5–5.1)
SODIUM: 138 mmol/L (ref 135–145)

## 2018-02-21 LAB — CBC WITH DIFFERENTIAL/PLATELET
BASOS ABS: 0.1 10*3/uL (ref 0–0.1)
Basophils Relative: 2 %
Eosinophils Absolute: 0.1 10*3/uL (ref 0–0.7)
Eosinophils Relative: 1 %
HCT: 32.4 % — ABNORMAL LOW (ref 40.0–52.0)
Hemoglobin: 10.9 g/dL — ABNORMAL LOW (ref 13.0–18.0)
LYMPHS PCT: 11 %
Lymphs Abs: 0.7 10*3/uL — ABNORMAL LOW (ref 1.0–3.6)
MCH: 31.8 pg (ref 26.0–34.0)
MCHC: 33.6 g/dL (ref 32.0–36.0)
MCV: 94.7 fL (ref 80.0–100.0)
Monocytes Absolute: 0.5 10*3/uL (ref 0.2–1.0)
Monocytes Relative: 8 %
Neutro Abs: 5.2 10*3/uL (ref 1.4–6.5)
Neutrophils Relative %: 78 %
Platelets: 174 10*3/uL (ref 150–440)
RBC: 3.42 MIL/uL — AB (ref 4.40–5.90)
RDW: 13.4 % (ref 11.5–14.5)
WBC: 6.6 10*3/uL (ref 3.8–10.6)

## 2018-02-21 MED ORDER — TRAMADOL HCL 50 MG PO TABS: 50.0000 mg | ORAL_TABLET | Freq: Four times a day (QID) | ORAL | 0 refills | Status: DC

## 2018-02-21 MED ORDER — OXYCODONE HCL 5 MG PO TABS: 5.0000 mg | ORAL_TABLET | ORAL | 0 refills | Status: DC | PRN

## 2018-02-21 MED ORDER — ENOXAPARIN SODIUM 40 MG/0.4ML ~~LOC~~ SOLN: 40.0000 mg | SUBCUTANEOUS | 0 refills | Status: DC

## 2018-02-21 NOTE — Discharge Instructions (Signed)
Instructions after Total Hip Replacement     J. Jeffrey Poggi, M.D.  J. Lance Cumi Sanagustin, PA-C     Dept. of Orthopaedics & Sports Medicine  [facility]  [address]  [place], [place]  [address]  Phone: [phone]   Fax: [phone]    DIET: . Drink plenty of non-alcoholic fluids. . Resume your normal diet. Include foods high in fiber.  ACTIVITY:  . You may use crutches or a walker with weight-bearing as tolerated, unless instructed otherwise. . You may be weaned off of the walker or crutches by your Physical Therapist.  . Do NOT reach below the level of your knees or cross your legs until allowed.    . Continue doing gentle exercises. Exercising will reduce the pain and swelling, increase motion, and prevent muscle weakness.   . Please continue to use the TED compression stockings for 6 weeks. You may remove the stockings at night, but should reapply them in the morning. . Do not drive or operate any equipment until instructed.  WOUND CARE:  . Continue to use ice packs periodically to reduce pain and swelling. . Keep the incision clean and dry. . You may bathe or shower after the staples are removed at the first office visit following surgery.  MEDICATIONS: . You may resume your regular medications. . Please take the pain medication as prescribed on the medication. . Do not take pain medication on an empty stomach. . You have been given a prescription for a blood thinner to prevent blood clots. Please take the medication as instructed. (NOTE: After completing a 2 week course of Lovenox, take one Enteric-coated aspirin once a day.) . Pain medications and iron supplements can cause constipation. Use a stool softener (Senokot or Colace) on a daily basis and a laxative (dulcolax or miralax) as needed. . Do not drive or drink alcoholic beverages when taking pain medications.  CALL THE OFFICE FOR: . Temperature above 101 degrees . Excessive bleeding or drainage on the  dressing. . Excessive swelling, coldness, or paleness of the toes. . Persistent nausea and vomiting.  FOLLOW-UP:  . You should have an appointment to return to the office in 2 weeks after surgery. . Arrangements have been made for continuation of Physical Therapy (either home therapy or outpatient therapy).  

## 2018-02-21 NOTE — Progress Notes (Signed)
Physical Therapy Treatment Patient Details Name: Anthony Rivas MRN: [id number] DOB: 1956/05/27 Today's Date: 02/21/2018    History of Present Illness 62 y/o male s/p hip replacement (posterior approach) 3/26    PT Comments    Pt agreeable to PT; reports 3/10 pain R hip. Pt progressing all mobility well although did require increased assist mobilizing RLE out of bed this morn. Pt demonstrates STS transfers well with good adherence to posterior precautions. Progressing ambulation well with mildly stiff RLE, but good reciprocal pattern and fluid forward advancement. Pt demonstrates stair climbing with good sequence and Min guard. Educated in long sit and seated exercise with review of posterior hip precautions with function and exercise. Pt up in chair comfortably. Continue PT to progress strength and endurance to improve all functional mobility.    Follow Up Recommendations  Home health PT     Equipment Recommendations       Recommendations for Other Services       Precautions / Restrictions Precautions Precautions: Posterior Hip;Fall Restrictions Weight Bearing Restrictions: Yes RLE Weight Bearing: Weight bearing as tolerated    Mobility  Bed Mobility Overal bed mobility: Needs Assistance Bed Mobility: Supine to Sit     Supine to sit: Min assist     General bed mobility comments: for RLE  Transfers Overall transfer level: Modified independent Equipment used: Rolling walker (2 wheeled)             General transfer comment: Good use of hands and adherence to posterior hip precautions  Ambulation/Gait Ambulation/Gait assistance: Supervision Ambulation Distance (Feet): 260 Feet Assistive device: Rolling walker (2 wheeled)     Gait velocity interpretation: Below normal speed for age/gender General Gait Details: Takes 10 ft or so to gain rhythm, but once pt does good cadence and fluidity. Good R foot position.    Stairs Stairs: Yes   Stair Management: Two  rails;Step to pattern;Forwards Number of Stairs: 4 General stair comments: Demonstrates sequencing well. Moderate lean on rails. Cautious  Wheelchair Mobility    Modified Rankin (Stroke Patients Only)       Balance Overall balance assessment: Modified Independent                                          Cognition Arousal/Alertness: Awake/alert Behavior During Therapy: WFL for tasks assessed/performed Overall Cognitive Status: Within Functional Limits for tasks assessed                                        Exercises Total Joint Exercises Ankle Circles/Pumps: AROM;Both;20 reps Heel Slides: AAROM;Right;10 reps(2 sets) Hip ABduction/ADduction: AAROM;Right;10 reps(2 sets) Long Arc Quad: Strengthening;Right;10 reps;Seated(2 sets)    General Comments        Pertinent Vitals/Pain Pain Assessment: 0-10 Pain Score: 3  Pain Location: R hip Pain Intervention(s): Monitored during session    Home Living                      Prior Function            PT Goals (current goals can now be found in the care plan section) Progress towards PT goals: Progressing toward goals    Frequency    BID      PT Plan Discharge plan needs to be updated  Co-evaluation              AM-PAC PT "6 Clicks" Daily Activity  Outcome Measure  Difficulty turning over in bed (including adjusting bedclothes, sheets and blankets)?: A Little Difficulty moving from lying on back to sitting on the side of the bed? : Unable Difficulty sitting down on and standing up from a chair with arms (e.g., wheelchair, bedside commode, etc,.)?: A Little Help needed moving to and from a bed to chair (including a wheelchair)?: None Help needed walking in hospital room?: None Help needed climbing 3-5 steps with a railing? : A Little 6 Click Score: 18    End of Session Equipment Utilized During Treatment: Gait belt Activity Tolerance: Patient tolerated  treatment well Patient left: in chair;with call bell/phone within reach;Other (comment)(ice, refused alarm, but states will call)   PT Visit Diagnosis: Muscle weakness (generalized) (M62.81);Difficulty in walking, not elsewhere classified (R26.2)     Time: [id number] PT Time Calculation (min) (ACUTE ONLY): 36 min  Charges:  $Gait Training: 8-22 mins $Therapeutic Exercise: 8-22 mins                    G Codes:        Scot DockHeidi E Barnes, PTA 02/21/2018, 12:08 PM

## 2018-02-21 NOTE — Anesthesia Postprocedure Evaluation (Signed)
Anesthesia Post Note  Patient: Arsenio LoaderSteven A Nemitz  Procedure(s) Performed: TOTAL HIP ARTHROPLASTY (Right Hip)  Patient location during evaluation: Nursing Unit Anesthesia Type: Spinal Level of consciousness: awake, awake and alert and oriented Pain management: pain level controlled Vital Signs Assessment: post-procedure vital signs reviewed and stable Respiratory status: spontaneous breathing Cardiovascular status: blood pressure returned to baseline Postop Assessment: no headache, adequate PO intake and no backache Anesthetic complications: no     Last Vitals:  Vitals:   02/20/18 2328 02/21/18 0420  BP: 134/84 126/78  Pulse: 77 82  Resp: 19 19  Temp: 37.2 C 37.6 C  SpO2: 98% 97%    Last Pain:  Vitals:   02/21/18 0420  TempSrc: Oral  PainSc:                  Karoline Caldwelleana Aldine Grainger

## 2018-02-21 NOTE — Care Management Note (Signed)
Case Management Note  Patient Details  Name: Anthony Rivas MRN: [id number] Date of Birth: 10-19-1956  Subjective/Objective:  POD # ! Right hip arthroplasty. Met with patient and his family at bedside to discuss discharge planning. Patient discharging today. He has a walker and bsc. Offered choice of home health agencies. Referral to [facility] for HHPT. Pharmacy: [facility]- [phone]. Called Lovenox 40 mg # 14 no refills  Cost of Lovenox is $ 10.00                Action/Plan: [facility] for HHPT. Lovenox called in  Expected Discharge Date:  02/21/18               Expected Discharge Plan:  [place]  In-House Referral:     Discharge planning Services  CM Consult  Post Acute Care Choice:  Home Health Choice offered to:  Patient  DME Arranged:    DME Agency:     HH Arranged:  PT [place]:  [facility] (formerly [facility])  Status of Service:  Completed, signed off  If discussed at [employer], dates discussed:    Additional Comments:  Jolly Mango, RN 02/21/2018, 1:33 PM

## 2018-02-21 NOTE — Progress Notes (Signed)
Patient discharge summary reviewed with verbal understanding. 3Rxs given upon discharge.  Education given r/t Lovenox injection. Escorted to personal vehicle via wc.

## 2018-02-21 NOTE — Progress Notes (Signed)
  Subjective: 1 Day Post-Op Procedure(s) (LRB): TOTAL HIP ARTHROPLASTY (Right) Patient reports pain as mild.   Patient is well, and has had no acute complaints or problems Plan is to go Home after hospital stay. Negative for chest pain and shortness of breath Fever: Mild temp last night, 99.6 Gastrointestinal:Negative for nausea and vomiting  Objective: Vital signs in last 24 hours: Temp:  [97.6 F (36.4 C)-99.6 F (37.6 C)] 99.6 F (37.6 C) (03/27 0420) Pulse Rate:  [59-82] 82 (03/27 0420) Resp:  [12-20] 19 (03/27 0420) BP: (112-154)/(78-91) 126/78 (03/27 0420) SpO2:  [97 %-100 %] 97 % (03/27 0420) Weight:  [80.4 kg (177 lb 3.2 oz)] 80.4 kg (177 lb 3.2 oz) (03/26 1116)  Intake/Output from previous day:  Intake/Output Summary (Last 24 hours) at 02/21/2018 0802 Last data filed at 02/21/2018 0558 Gross per 24 hour  Intake 5382 ml  Output 2700 ml  Net 2682 ml    Intake/Output this shift: No intake/output data recorded.  Labs: Recent Labs    02/21/18 0356  HGB 10.9*   Recent Labs    02/21/18 0356  WBC 6.6  RBC 3.42*  HCT 32.4*  PLT 174   Recent Labs    02/21/18 0356  NA 138  K 3.9  CL 105  CO2 25  BUN 8  CREATININE 0.83  GLUCOSE 117*  CALCIUM 8.2*   No results for input(s): LABPT, INR in the last 72 hours.   EXAM General - Patient is Alert, Appropriate and Oriented Extremity - Neurovascular intact Sensation intact distally Intact pulses distally Dorsiflexion/Plantar flexion intact Incision: scant drainage No cellulitis present Dressing/Incision - blood tinged drainage, very mild Motor Function - intact, moving foot and toes well on exam.  Abdomen with normal BS, mild tympany with mild distention.  Past Medical History:  Diagnosis Date  . Anemia   . B12 deficiency 2019  . Hypogonadism in male   . TMJ (dislocation of temporomandibular joint)    wears a mouth device    Assessment/Plan: 1 Day Post-Op Procedure(s) (LRB): TOTAL HIP  ARTHROPLASTY (Right) Active Problems:   Status post total hip replacement, right  Estimated body mass index is 27.75 kg/m as calculated from the following:   Height as of this encounter: 5\' 7"  (1.702 m).   Weight as of this encounter: 80.4 kg (177 lb 3.2 oz). Advance diet Up with therapy D/C IV fluids  when tolerating po intake.  Mild Fever last night, WBC 6.6. Did well with therapy yesterday.  Stairs today. Begin working on having a BM, patient is passing gas without difficulty. Plan will be for possible discharge home this afternoon pending progress with PT.  DVT Prophylaxis - Lovenox, Foot Pumps and TED hose Weight-Bearing as tolerated to right leg  J. Horris LatinoLance Whittany Parish, PA-C [facility] 02/21/2018, 8:02 AM

## 2018-02-21 NOTE — Progress Notes (Signed)
Clinical Social Worker (CSW) received SNF consult. PT is recommending home health. RN case manager aware of above. Please reconsult if future social work needs arise. CSW signing off.   Angello Chien, LCSW [phone] 

## 2018-02-21 NOTE — Discharge Summary (Signed)
Physician Discharge Summary  Patient ID: OMAREE FUQUA MRN: [id number] DOB/AGE: 05/17/1956 62 y.o.  Admit date: 02/20/2018 Discharge date: 02/21/2018  Admission Diagnoses:  primary osteoarthritis of right hip  Discharge Diagnoses: Patient Active Problem List   Diagnosis Date Noted  . Status post total hip replacement, right 02/20/2018  Primary osteoarthritis of the right hip  Past Medical History:  Diagnosis Date  . Anemia   . B12 deficiency 2019  . Hypogonadism in male   . TMJ (dislocation of temporomandibular joint)    wears a mouth device     Transfusion: None.   Consultants (if any):   Discharged Condition: Improved  Hospital Course: BERTHEL BAGNALL is an 62 y.o. male who was admitted 02/20/2018 with a diagnosis of primary osteoarthritis of the right hip and went to the operating room on 02/20/2018 and underwent the above named procedures.    Surgeries: Procedure(s): TOTAL HIP ARTHROPLASTY on 02/20/2018 Patient tolerated the surgery well. Taken to PACU where she was stabilized and then transferred to the orthopedic floor.  Started on Lovenox 40mg  q 24 hrs. Foot pumps applied bilaterally at 80 mm. Heels elevated on bed with rolled towels. No evidence of DVT. Negative [place]. Physical therapy started on day #1 for gait training and transfer. OT started day #1 for ADL and assisted devices.  Patient's IV was removed on POD1.  Pt's foley was removed following surgery.  Implants: Biomet press-fit system with a #13 laterally offset Echo femoral stem, a 58 mm acetabular shell with an E-poly hi-wall liner, and a 36 mm ceramic head with a +3 mm neck.  He was given perioperative antibiotics:  Anti-infectives (From admission, onward)   Start     Dose/Rate Route Frequency Ordered Stop   02/20/18 1400  ceFAZolin (ANCEF) IVPB 2g/100 mL premix     2 g 200 mL/hr over 30 Minutes Intravenous Every 6 hours 02/20/18 1115 02/21/18 0158   02/20/18 0554  ceFAZolin (ANCEF) 2-4 GM/100ML-% IVPB     Note to Pharmacy:  Renie Ora   : cabinet override      02/20/18 0554 02/20/18 0750   02/19/18 2230  ceFAZolin (ANCEF) IVPB 2g/100 mL premix     2 g 200 mL/hr over 30 Minutes Intravenous  Once 02/19/18 2228 02/20/18 0805    .  He was given sequential compression devices, early ambulation, and Lovenox for DVT prophylaxis.  He benefited maximally from the hospital stay and there were no complications.    Recent vital signs:  Vitals:   02/21/18 0420 02/21/18 0807  BP: 126/78 129/85  Pulse: 82 73  Resp: 19 18  Temp: 99.6 F (37.6 C) 98.6 F (37 C)  SpO2: 97% 97%    Recent laboratory studies:  Lab Results  Component Value Date   HGB 10.9 (L) 02/21/2018   HGB 13.6 02/08/2018   Lab Results  Component Value Date   WBC 6.6 02/21/2018   PLT 174 02/21/2018   Lab Results  Component Value Date   INR 0.92 02/08/2018   Lab Results  Component Value Date   NA 138 02/21/2018   K 3.9 02/21/2018   CL 105 02/21/2018   CO2 25 02/21/2018   BUN 8 02/21/2018   CREATININE 0.83 02/21/2018   GLUCOSE 117 (H) 02/21/2018    Discharge Medications:   Allergies as of 02/21/2018   No Known Allergies     Medication List    TAKE these medications   acetaminophen 650 MG CR tablet Commonly known as:  TYLENOL Take 1,300 mg by mouth daily as needed for pain.   B-12 COMPLIANCE INJECTION IJ Inject 1 Dose as directed every 5 (five) weeks.   enoxaparin 40 MG/0.4ML injection Commonly known as:  LOVENOX Inject 0.4 mLs (40 mg total) into the skin daily.   oxyCODONE 5 MG immediate release tablet Commonly known as:  Oxy IR/ROXICODONE Take 1-2 tablets (5-10 mg total) by mouth every 4 (four) hours as needed for moderate pain.   traMADol 50 MG tablet Commonly known as:  ULTRAM Take 1-2 tablets (50-100 mg total) by mouth every 6 (six) hours.       Diagnostic Studies: Dg Chest 2 View  Result Date: 02/08/2018 CLINICAL DATA:  Preoperative assessment for right hip surgery EXAM: CHEST -  2 VIEW COMPARISON:  None. FINDINGS: Lungs are clear. The heart size and pulmonary vascularity are normal. No adenopathy. There is degenerative change in the thoracic spine. IMPRESSION: No edema or consolidation. Electronically Signed   By: Bretta BangWilliam  Woodruff III M.D.   On: 02/08/2018 15:20   Dg Hip Unilat W Or W/o Pelvis 2-3 Views Right  Result Date: 02/20/2018 CLINICAL DATA:  Right hip replacement. EXAM: DG HIP (WITH OR WITHOUT PELVIS) 2-3V RIGHT COMPARISON:  No prior. FINDINGS: Total right hip replacement. Hardware intact. Anatomic alignment. Prior left hip replacement. IMPRESSION: Total right hip replacement with anatomic alignment. Electronically Signed   By: Maisie Fushomas  Register   On: 02/20/2018 10:36   Disposition:  Plan is for discharge home this afternoon following PT.  Follow-up Information    Anson OregonMcGhee, Jera Headings Lance, PA-C Follow up in 14 day(s).   Specialty:  Physician Assistant Why:  Mindi SlickerStaple Removal. Contact information: [address] [facility] [place] [address] [phone]          Signed: Meriel PicaJames L Danil Wedge PA-C 02/21/2018, 1:21 PM

## 2018-02-22 LAB — SURGICAL PATHOLOGY

## 2018-08-20 IMAGING — DX DG HIP (WITH OR WITHOUT PELVIS) 2-3V*R*
2 series · 2 of 2 positions shown · non-contrast
Comparison: No prior.

CLINICAL DATA: Right hip replacement.

EXAM:
DG HIP (WITH OR WITHOUT PELVIS) 2-3V RIGHT

[pelvis ap]
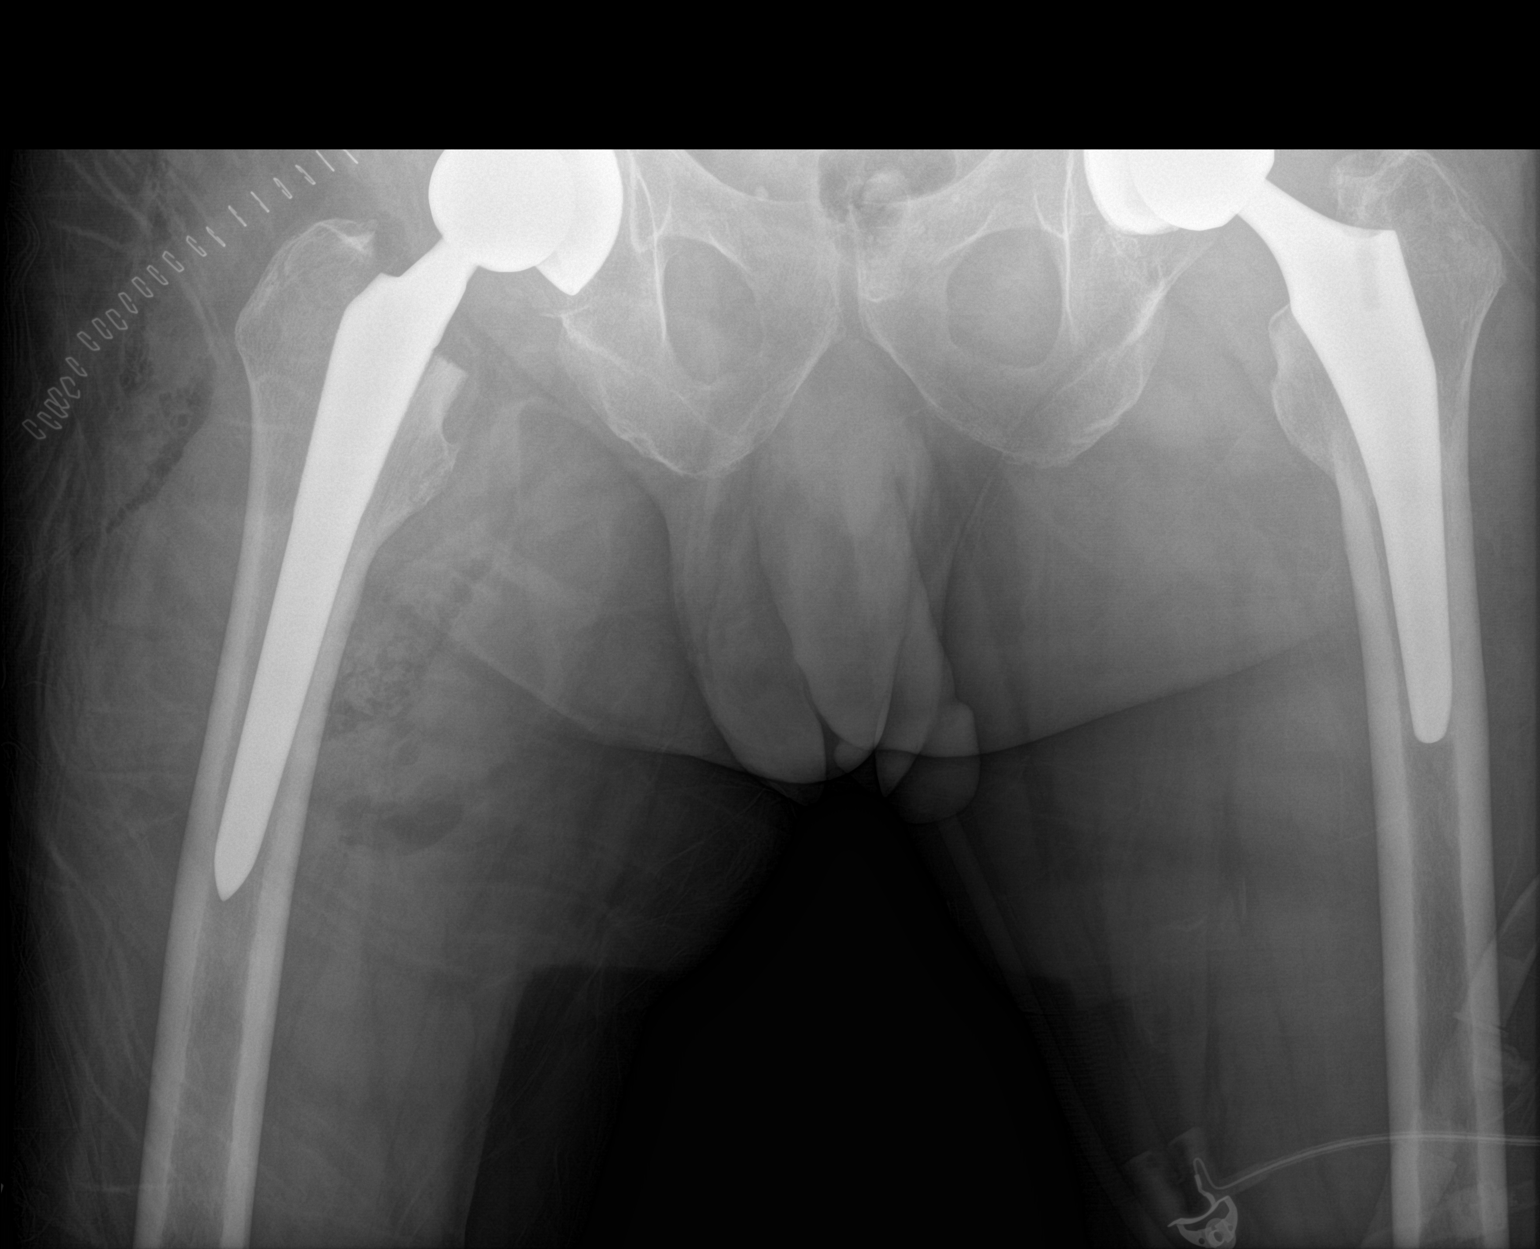

[hip lat]
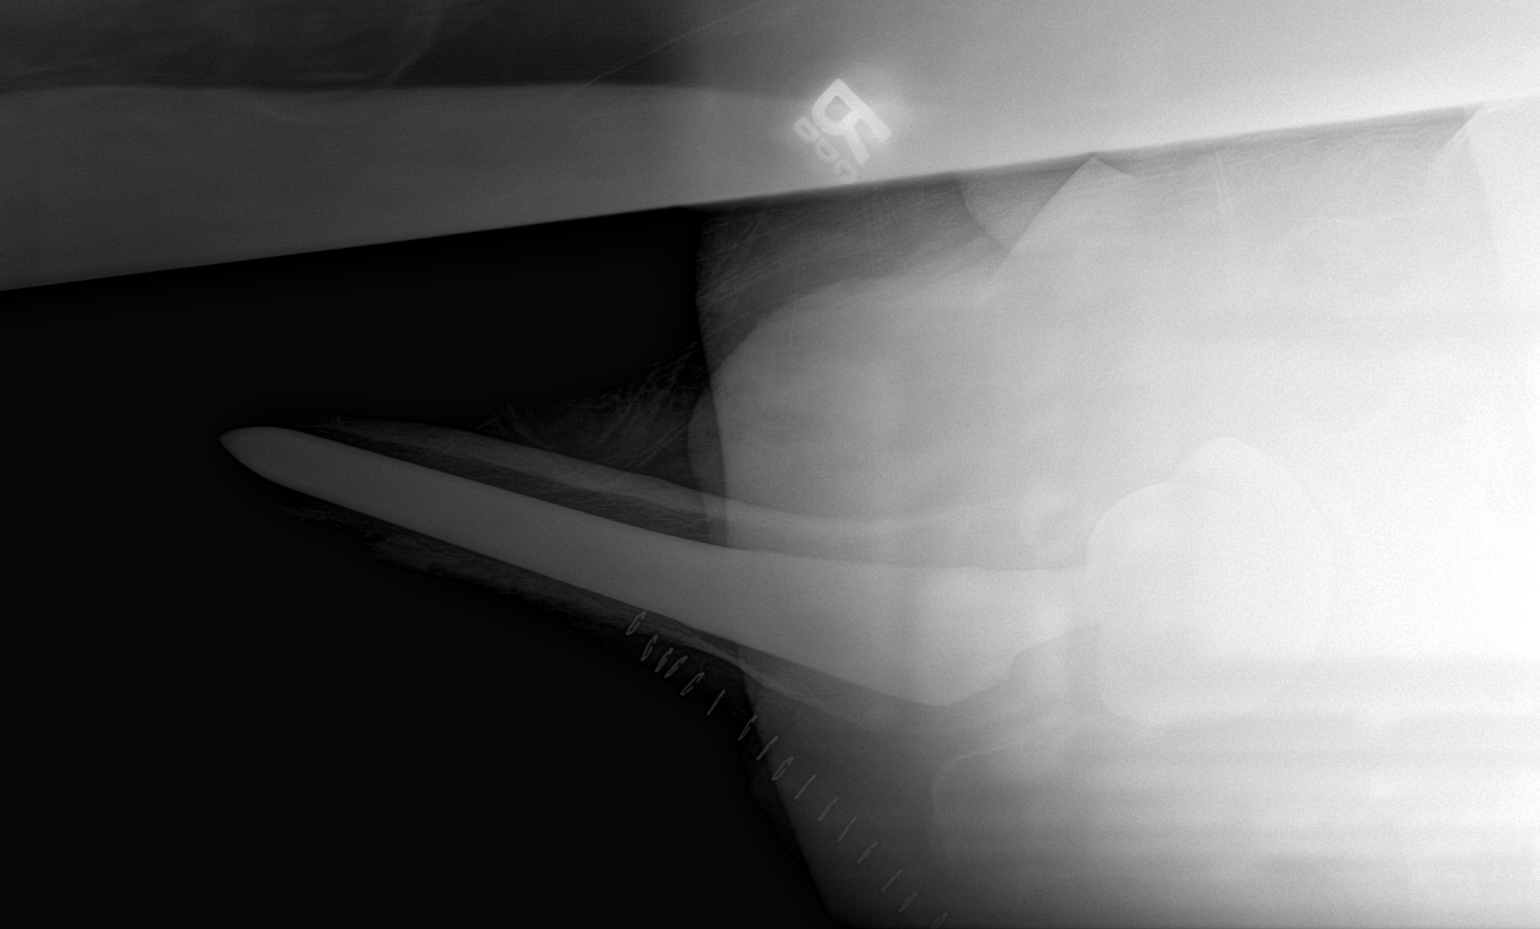

[2 of 2 positions shown; findings below may reference images not displayed]

FINDINGS: Total right hip replacement. Hardware intact. Anatomic alignment.
Prior left hip replacement.
IMPRESSION: Total right hip replacement with anatomic alignment.

## 2019-02-27 ENCOUNTER — Ambulatory Visit: Payer: Managed Care, Other (non HMO) | Admitting: Family Medicine

## 2022-01-14 ENCOUNTER — Emergency Department: Payer: Managed Care, Other (non HMO)

## 2022-01-14 ENCOUNTER — Emergency Department
Admission: EM | Admit: 2022-01-14 | Discharge: 2022-01-14 | Disposition: A | Discharge status: Home / Self Care | Payer: Managed Care, Other (non HMO) | Attending: Emergency Medicine | Admitting: Emergency Medicine

## 2022-01-14 ENCOUNTER — Other Ambulatory Visit: Payer: Self-pay

## 2022-01-14 ENCOUNTER — Encounter: Payer: Self-pay | Admitting: Emergency Medicine

## 2022-01-14 DIAGNOSIS — M25512 Pain in left shoulder: Secondary | ICD-10-CM | POA: Insufficient documentation

## 2022-01-14 DIAGNOSIS — R0789 Other chest pain: Secondary | ICD-10-CM | POA: Insufficient documentation

## 2022-01-14 DIAGNOSIS — M7918 Myalgia, other site: Secondary | ICD-10-CM

## 2022-01-14 DIAGNOSIS — Y92410 Unspecified street and highway as the place of occurrence of the external cause: Secondary | ICD-10-CM | POA: Insufficient documentation

## 2022-01-14 MED ORDER — CYCLOBENZAPRINE HCL 5 MG PO TABS: 5.0000 mg | ORAL_TABLET | Freq: Three times a day (TID) | ORAL | 0 refills | Status: AC | PRN

## 2022-01-14 MED ORDER — IBUPROFEN 800 MG PO TABS
800.0000 mg | ORAL_TABLET | Freq: Once | ORAL | Status: AC
  Administered 2022-01-14: 800 mg via ORAL
  Filled 2022-01-14: qty 1
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
## 2023-04-05 ENCOUNTER — Ambulatory Visit: Payer: Managed Care, Other (non HMO)

## 2023-04-05 DIAGNOSIS — D124 Benign neoplasm of descending colon: Secondary | ICD-10-CM

## 2023-04-05 DIAGNOSIS — K5730 Diverticulosis of large intestine without perforation or abscess without bleeding: Secondary | ICD-10-CM

## 2023-04-05 DIAGNOSIS — D128 Benign neoplasm of rectum: Secondary | ICD-10-CM | POA: Diagnosis not present

## 2023-04-05 DIAGNOSIS — Z86010 Personal history of colonic polyps: Secondary | ICD-10-CM
